# Patient Record
Sex: Female | Born: 1950 | Hispanic: No
Health system: Southern US, Community
[De-identification: ages and names within clinical notes are randomized; demographics above are authoritative.]

---

## 1998-01-09 ENCOUNTER — Other Ambulatory Visit: Admission: RE | Admit: 1998-01-09 | Discharge: 1998-01-09 | Payer: Self-pay | Admitting: *Deleted

## 1999-11-22 ENCOUNTER — Other Ambulatory Visit: Admission: RE | Admit: 1999-11-22 | Discharge: 1999-11-22 | Payer: Self-pay | Admitting: *Deleted

## 2004-12-06 ENCOUNTER — Other Ambulatory Visit: Admission: RE | Admit: 2004-12-06 | Discharge: 2004-12-06 | Payer: Self-pay | Admitting: Family Medicine

## 2005-03-24 ENCOUNTER — Other Ambulatory Visit: Admission: RE | Admit: 2005-03-24 | Discharge: 2005-03-24 | Payer: Self-pay | Admitting: Family Medicine

## 2008-01-02 ENCOUNTER — Other Ambulatory Visit: Admission: RE | Admit: 2008-01-02 | Discharge: 2008-01-02 | Payer: Self-pay | Admitting: Family Medicine

## 2009-02-25 ENCOUNTER — Other Ambulatory Visit: Admission: RE | Admit: 2009-02-25 | Discharge: 2009-02-25 | Payer: Self-pay | Admitting: Family Medicine

## 2012-12-11 ENCOUNTER — Other Ambulatory Visit: Payer: Self-pay | Admitting: Family Medicine

## 2012-12-11 ENCOUNTER — Other Ambulatory Visit (HOSPITAL_COMMUNITY)
Admission: RE | Admit: 2012-12-11 | Discharge: 2012-12-11 | Disposition: A | Discharge status: Home / Self Care | Payer: BC Managed Care – PPO | Source: Ambulatory Visit | Attending: Family Medicine | Admitting: Family Medicine

## 2012-12-11 DIAGNOSIS — Z01419 Encounter for gynecological examination (general) (routine) without abnormal findings: Secondary | ICD-10-CM | POA: Insufficient documentation

## 2015-04-03 ENCOUNTER — Other Ambulatory Visit: Payer: Self-pay | Admitting: General Surgery

## 2016-12-06 ENCOUNTER — Other Ambulatory Visit: Payer: Self-pay | Admitting: Family Medicine

## 2016-12-06 ENCOUNTER — Other Ambulatory Visit (HOSPITAL_COMMUNITY)
Admission: RE | Admit: 2016-12-06 | Discharge: 2016-12-06 | Disposition: A | Discharge status: Home / Self Care | Payer: BC Managed Care – PPO | Source: Ambulatory Visit | Attending: Family Medicine | Admitting: Family Medicine

## 2016-12-06 DIAGNOSIS — Z124 Encounter for screening for malignant neoplasm of cervix: Secondary | ICD-10-CM | POA: Insufficient documentation

## 2016-12-08 LAB — CYTOLOGY - PAP: DIAGNOSIS: NEGATIVE

## 2019-05-22 DIAGNOSIS — Z1231 Encounter for screening mammogram for malignant neoplasm of breast: Secondary | ICD-10-CM | POA: Diagnosis not present

## 2019-06-20 DIAGNOSIS — Z1211 Encounter for screening for malignant neoplasm of colon: Secondary | ICD-10-CM | POA: Diagnosis not present

## 2019-06-20 DIAGNOSIS — Z1389 Encounter for screening for other disorder: Secondary | ICD-10-CM | POA: Diagnosis not present

## 2019-06-20 DIAGNOSIS — Z23 Encounter for immunization: Secondary | ICD-10-CM | POA: Diagnosis not present

## 2019-06-20 DIAGNOSIS — Z0000 Encounter for general adult medical examination without abnormal findings: Secondary | ICD-10-CM | POA: Diagnosis not present

## 2019-06-20 DIAGNOSIS — E2839 Other primary ovarian failure: Secondary | ICD-10-CM | POA: Diagnosis not present

## 2019-06-20 DIAGNOSIS — E039 Hypothyroidism, unspecified: Secondary | ICD-10-CM | POA: Diagnosis not present

## 2019-06-20 DIAGNOSIS — E785 Hyperlipidemia, unspecified: Secondary | ICD-10-CM | POA: Diagnosis not present

## 2019-07-07 ENCOUNTER — Ambulatory Visit: Payer: BC Managed Care – PPO | Attending: Internal Medicine

## 2019-07-07 DIAGNOSIS — Z23 Encounter for immunization: Secondary | ICD-10-CM | POA: Insufficient documentation

## 2019-07-07 NOTE — Progress Notes (Signed)
   Covid-[facility] Vaccination [facility]  Name:  Ariana Taylor    MRN: [id number] DOB: 04-Jun-1950  07/07/2019  Ms. Ariana Taylor was observed post Covid-19 immunization for 15 minutes without incidence. She was provided with Vaccine Information Sheet and instruction to access the V-Safe system.   Ms. Ariana Taylor was instructed to call 911 with any severe reactions post vaccine: Marland Kitchen Difficulty breathing  . Swelling of your face and throat  . A fast heartbeat  . A bad rash all over your body  . Dizziness and weakness    Immunizations Administered    Name Date Dose VIS Date Route   Pfizer COVID-19 Vaccine 07/07/2019  8:37 AM 0.3 mL 04/26/2019 Intramuscular   Manufacturer: [place]   Lot: [id number]   [place]: [id number]

## 2019-07-11 DIAGNOSIS — Z780 Asymptomatic menopausal state: Secondary | ICD-10-CM | POA: Diagnosis not present

## 2019-07-11 DIAGNOSIS — M8589 Other specified disorders of bone density and structure, multiple sites: Secondary | ICD-10-CM | POA: Diagnosis not present

## 2019-07-31 ENCOUNTER — Ambulatory Visit: Payer: BC Managed Care – PPO | Attending: Internal Medicine

## 2019-07-31 DIAGNOSIS — Z23 Encounter for immunization: Secondary | ICD-10-CM

## 2019-07-31 NOTE — Progress Notes (Signed)
   Covid-19 Vaccination Clinic  Name:  Ariana Taylor    MRN: [id number] DOB: 02-01-1951  07/31/2019  [name] was observed post Covid-19 immunization for 15 minutes without incident. She was provided with Vaccine Information Sheet and instruction to access the V-Safe system.   [name] was instructed to call 911 with any severe reactions post vaccine: Marland Kitchen Difficulty breathing  . Swelling of face and throat  . A fast heartbeat  . A bad rash all over body  . Dizziness and weakness   Immunizations Administered    Name Date Dose VIS Date Route   Pfizer COVID-19 Vaccine 07/31/2019  8:37 AM 0.3 mL 04/26/2019 Intramuscular   Manufacturer: [place]   Lot: [id number]   [place]: [id number]

## 2020-02-12 DIAGNOSIS — Z20828 Contact with and (suspected) exposure to other viral communicable diseases: Secondary | ICD-10-CM | POA: Diagnosis not present

## 2020-02-17 DIAGNOSIS — D123 Benign neoplasm of transverse colon: Secondary | ICD-10-CM | POA: Diagnosis not present

## 2020-02-17 DIAGNOSIS — K648 Other hemorrhoids: Secondary | ICD-10-CM | POA: Diagnosis not present

## 2020-02-17 DIAGNOSIS — K6289 Other specified diseases of anus and rectum: Secondary | ICD-10-CM | POA: Diagnosis not present

## 2020-02-17 DIAGNOSIS — K5730 Diverticulosis of large intestine without perforation or abscess without bleeding: Secondary | ICD-10-CM | POA: Diagnosis not present

## 2020-02-17 DIAGNOSIS — Z1211 Encounter for screening for malignant neoplasm of colon: Secondary | ICD-10-CM | POA: Diagnosis not present

## 2020-02-19 DIAGNOSIS — D123 Benign neoplasm of transverse colon: Secondary | ICD-10-CM | POA: Diagnosis not present

## 2020-03-16 DIAGNOSIS — Z0100 Encounter for examination of eyes and vision without abnormal findings: Secondary | ICD-10-CM | POA: Diagnosis not present

## 2020-03-16 DIAGNOSIS — R262 Difficulty in walking, not elsewhere classified: Secondary | ICD-10-CM | POA: Diagnosis not present

## 2020-03-16 DIAGNOSIS — M6281 Muscle weakness (generalized): Secondary | ICD-10-CM | POA: Diagnosis not present

## 2020-05-29 DIAGNOSIS — Z1231 Encounter for screening mammogram for malignant neoplasm of breast: Secondary | ICD-10-CM | POA: Diagnosis not present

## 2020-06-08 DIAGNOSIS — R202 Paresthesia of skin: Secondary | ICD-10-CM | POA: Diagnosis not present

## 2020-06-08 DIAGNOSIS — E039 Hypothyroidism, unspecified: Secondary | ICD-10-CM | POA: Diagnosis not present

## 2020-06-08 DIAGNOSIS — E785 Hyperlipidemia, unspecified: Secondary | ICD-10-CM | POA: Diagnosis not present

## 2020-07-01 DIAGNOSIS — E039 Hypothyroidism, unspecified: Secondary | ICD-10-CM | POA: Diagnosis not present

## 2020-07-01 DIAGNOSIS — Z1389 Encounter for screening for other disorder: Secondary | ICD-10-CM | POA: Diagnosis not present

## 2020-07-01 DIAGNOSIS — Z0000 Encounter for general adult medical examination without abnormal findings: Secondary | ICD-10-CM | POA: Diagnosis not present

## 2020-07-01 DIAGNOSIS — R202 Paresthesia of skin: Secondary | ICD-10-CM | POA: Diagnosis not present

## 2020-07-01 DIAGNOSIS — E785 Hyperlipidemia, unspecified: Secondary | ICD-10-CM | POA: Diagnosis not present

## 2020-07-14 DIAGNOSIS — M48061 Spinal stenosis, lumbar region without neurogenic claudication: Secondary | ICD-10-CM | POA: Diagnosis not present

## 2020-07-15 ENCOUNTER — Other Ambulatory Visit: Payer: Self-pay | Admitting: Orthopedic Surgery

## 2020-07-15 DIAGNOSIS — M545 Low back pain, unspecified: Secondary | ICD-10-CM

## 2020-07-15 DIAGNOSIS — M48061 Spinal stenosis, lumbar region without neurogenic claudication: Secondary | ICD-10-CM

## 2020-08-20 ENCOUNTER — Ambulatory Visit
Admission: RE | Admit: 2020-08-20 | Discharge: 2020-08-20 | Disposition: A | Discharge status: Home / Self Care | Payer: BC Managed Care – PPO | Source: Ambulatory Visit | Attending: Orthopedic Surgery | Admitting: Orthopedic Surgery

## 2020-08-20 ENCOUNTER — Other Ambulatory Visit: Payer: Self-pay

## 2020-08-20 DIAGNOSIS — M545 Low back pain, unspecified: Secondary | ICD-10-CM

## 2020-08-20 DIAGNOSIS — M48061 Spinal stenosis, lumbar region without neurogenic claudication: Secondary | ICD-10-CM

## 2020-09-04 DIAGNOSIS — M5416 Radiculopathy, lumbar region: Secondary | ICD-10-CM | POA: Diagnosis not present

## 2020-09-14 DIAGNOSIS — M48061 Spinal stenosis, lumbar region without neurogenic claudication: Secondary | ICD-10-CM | POA: Diagnosis not present

## 2020-09-22 DIAGNOSIS — M48061 Spinal stenosis, lumbar region without neurogenic claudication: Secondary | ICD-10-CM | POA: Diagnosis not present

## 2020-10-13 DIAGNOSIS — M48061 Spinal stenosis, lumbar region without neurogenic claudication: Secondary | ICD-10-CM | POA: Diagnosis not present

## 2021-01-04 DIAGNOSIS — Z111 Encounter for screening for respiratory tuberculosis: Secondary | ICD-10-CM | POA: Diagnosis not present

## 2021-06-04 DIAGNOSIS — Z1231 Encounter for screening mammogram for malignant neoplasm of breast: Secondary | ICD-10-CM | POA: Diagnosis not present

## 2021-06-18 DIAGNOSIS — R202 Paresthesia of skin: Secondary | ICD-10-CM | POA: Diagnosis not present

## 2021-06-18 DIAGNOSIS — L538 Other specified erythematous conditions: Secondary | ICD-10-CM | POA: Diagnosis not present

## 2021-06-18 DIAGNOSIS — L821 Other seborrheic keratosis: Secondary | ICD-10-CM | POA: Diagnosis not present

## 2021-06-18 DIAGNOSIS — L298 Other pruritus: Secondary | ICD-10-CM | POA: Diagnosis not present

## 2021-06-18 DIAGNOSIS — L814 Other melanin hyperpigmentation: Secondary | ICD-10-CM | POA: Diagnosis not present

## 2021-06-18 DIAGNOSIS — L570 Actinic keratosis: Secondary | ICD-10-CM | POA: Diagnosis not present

## 2021-06-18 DIAGNOSIS — L708 Other acne: Secondary | ICD-10-CM | POA: Diagnosis not present

## 2021-06-18 DIAGNOSIS — L820 Inflamed seborrheic keratosis: Secondary | ICD-10-CM | POA: Diagnosis not present

## 2021-06-18 DIAGNOSIS — D2339 Other benign neoplasm of skin of other parts of face: Secondary | ICD-10-CM | POA: Diagnosis not present

## 2021-06-18 DIAGNOSIS — R208 Other disturbances of skin sensation: Secondary | ICD-10-CM | POA: Diagnosis not present

## 2021-07-12 DIAGNOSIS — L709 Acne, unspecified: Secondary | ICD-10-CM | POA: Diagnosis not present

## 2021-07-12 DIAGNOSIS — Z1389 Encounter for screening for other disorder: Secondary | ICD-10-CM | POA: Diagnosis not present

## 2021-07-12 DIAGNOSIS — N1831 Chronic kidney disease, stage 3a: Secondary | ICD-10-CM | POA: Diagnosis not present

## 2021-07-12 DIAGNOSIS — Z0000 Encounter for general adult medical examination without abnormal findings: Secondary | ICD-10-CM | POA: Diagnosis not present

## 2021-07-12 DIAGNOSIS — Z124 Encounter for screening for malignant neoplasm of cervix: Secondary | ICD-10-CM | POA: Diagnosis not present

## 2021-07-12 DIAGNOSIS — E785 Hyperlipidemia, unspecified: Secondary | ICD-10-CM | POA: Diagnosis not present

## 2021-07-12 DIAGNOSIS — E039 Hypothyroidism, unspecified: Secondary | ICD-10-CM | POA: Diagnosis not present

## 2021-07-12 DIAGNOSIS — N841 Polyp of cervix uteri: Secondary | ICD-10-CM | POA: Diagnosis not present

## 2021-07-12 DIAGNOSIS — Z1159 Encounter for screening for other viral diseases: Secondary | ICD-10-CM | POA: Diagnosis not present

## 2021-09-13 DIAGNOSIS — R058 Other specified cough: Secondary | ICD-10-CM | POA: Diagnosis not present

## 2021-12-06 DIAGNOSIS — E039 Hypothyroidism, unspecified: Secondary | ICD-10-CM | POA: Diagnosis not present

## 2021-12-06 DIAGNOSIS — M79672 Pain in left foot: Secondary | ICD-10-CM | POA: Diagnosis not present

## 2021-12-20 DIAGNOSIS — L578 Other skin changes due to chronic exposure to nonionizing radiation: Secondary | ICD-10-CM | POA: Diagnosis not present

## 2021-12-20 DIAGNOSIS — B001 Herpesviral vesicular dermatitis: Secondary | ICD-10-CM | POA: Diagnosis not present

## 2021-12-20 DIAGNOSIS — L814 Other melanin hyperpigmentation: Secondary | ICD-10-CM | POA: Diagnosis not present

## 2021-12-20 DIAGNOSIS — D2239 Melanocytic nevi of other parts of face: Secondary | ICD-10-CM | POA: Diagnosis not present

## 2021-12-20 DIAGNOSIS — D220 Melanocytic nevi of lip: Secondary | ICD-10-CM | POA: Diagnosis not present

## 2022-03-01 DIAGNOSIS — L918 Other hypertrophic disorders of the skin: Secondary | ICD-10-CM | POA: Diagnosis not present

## 2022-03-25 IMAGING — MR MR LUMBAR SPINE W/O CM
4 of 5 series · 25 of 48 positions shown · non-contrast
Comparison: None.

CLINICAL DATA: Lumbar spinal stenosis without neurogenic
claudication. Low back pain.

EXAM:
MRI LUMBAR SPINE WITHOUT CONTRAST
TECHNIQUE: Multiplanar, multisequence MR imaging of the lumbar spine was
performed. No intravenous contrast was administered.

[Series 2: T2 · sagittal · 4.0mm · 0.53mm/px · 6 of 16 slices shown (1 of 2)]
[im 1/16]
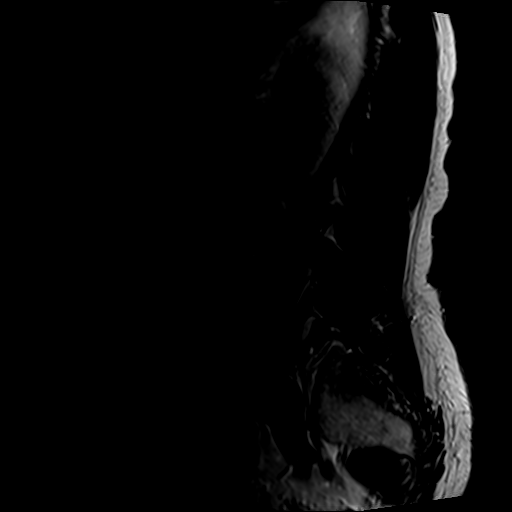
[im 4/16]
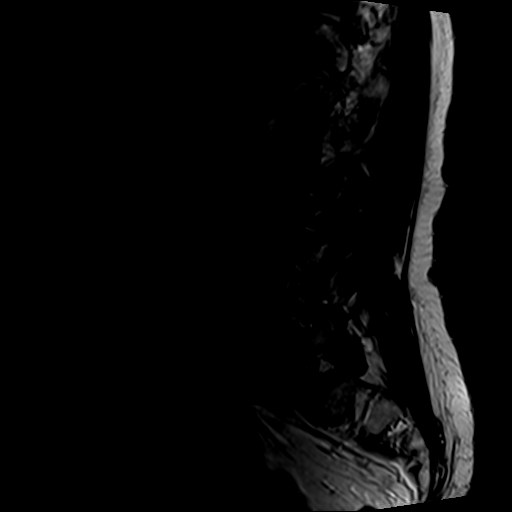
[im 7/16]
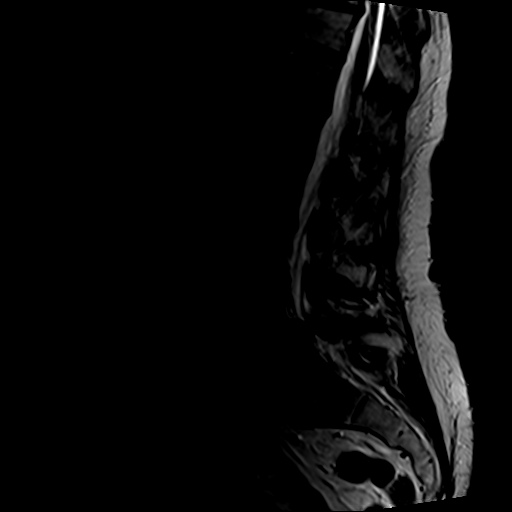
[im 10/16]
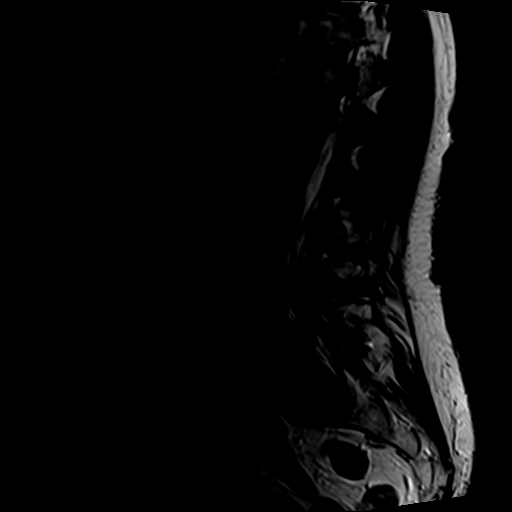
[im 13/16]
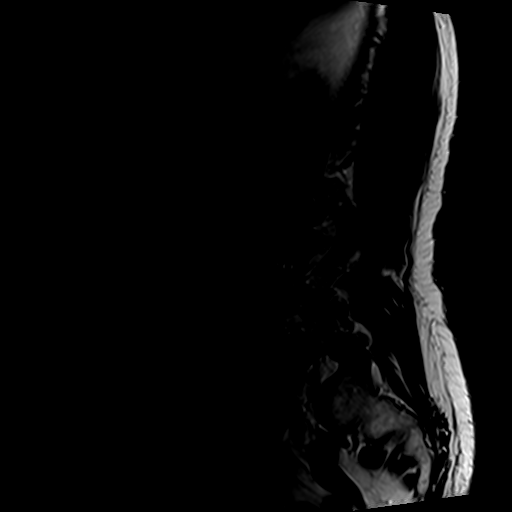
[im 16/16]
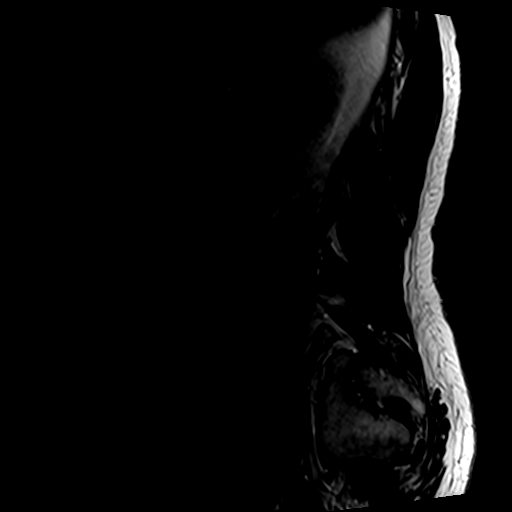

[Series 4: T1 · sagittal · 4.0mm · 0.53mm/px · 7 of 16 slices shown (1 of 2)]
[im 1/16]
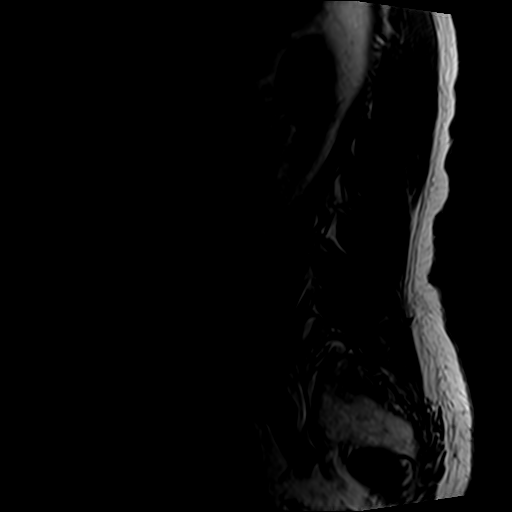
[im 3/16]
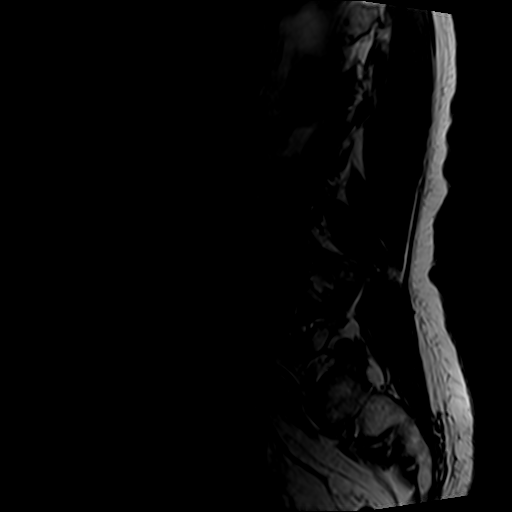
[im 6/16]
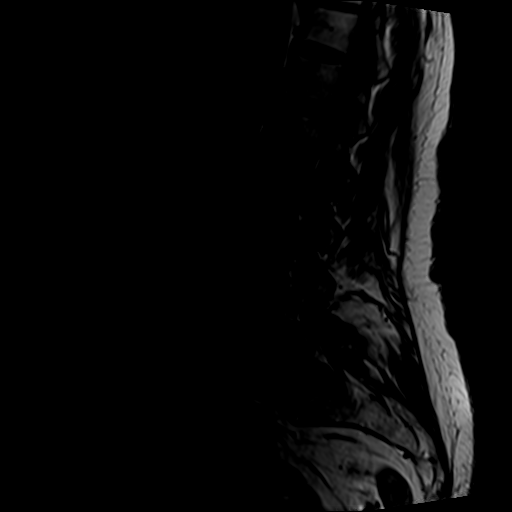
[im 8/16]
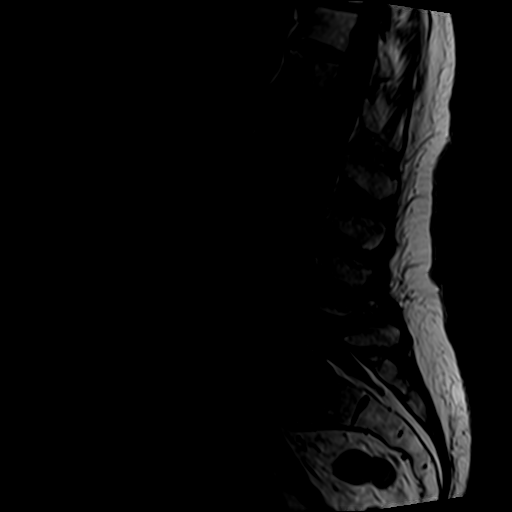
[im 11/16]
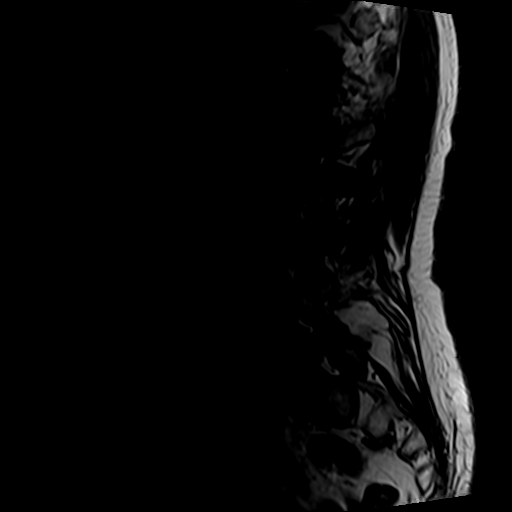
[im 13/16]
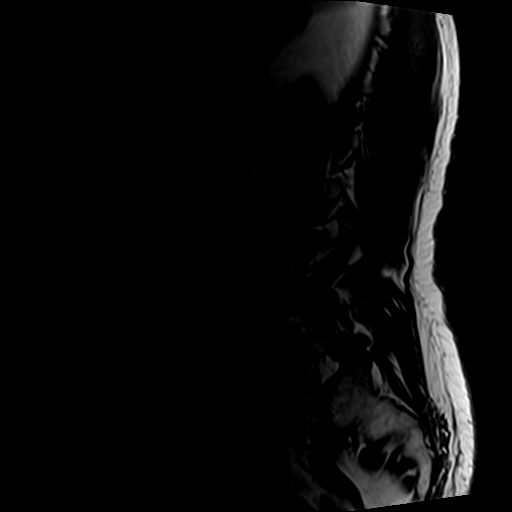
[im 16/16]
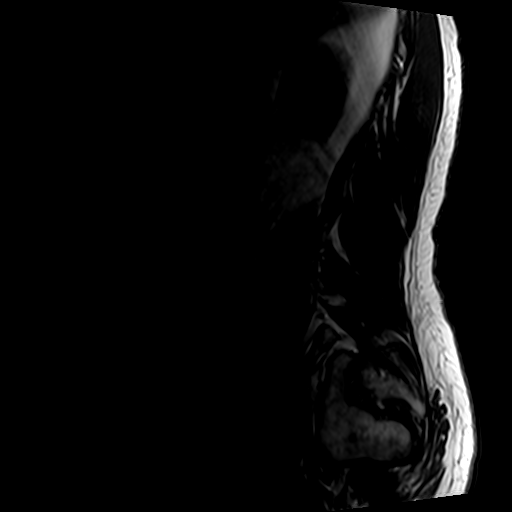

[Series 5: T2 · axial · 4.0mm · 0.70mm/px · z∈[-89,+118]mm · 8 of 35 slices shown (2 of 2)]
[im 1/35]
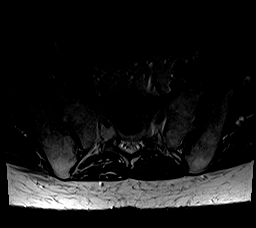
[im 6/35]
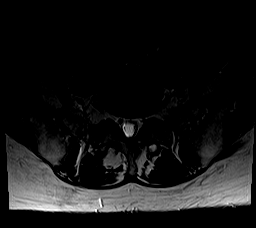
[im 11/35]
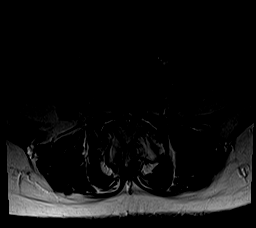
[im 16/35]
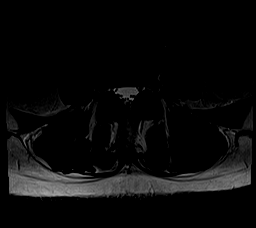
[im 19/35]
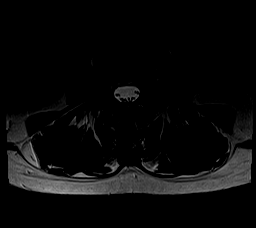
[im 24/35]
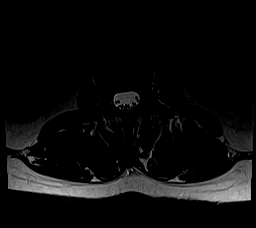
[im 29/35]
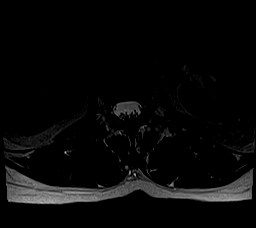
[im 35/35]
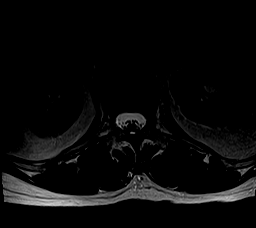

[Series 6: T1 · axial · 4.0mm · 0.35mm/px · z∈[-89,+87]mm · 4 of 35 slices shown (2 of 2)]
[im 1/35]
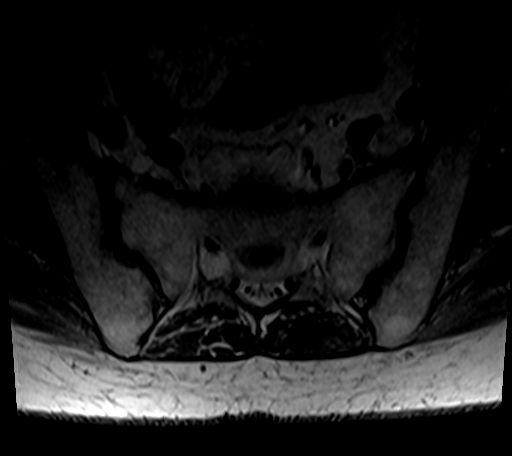
[im 6/35]
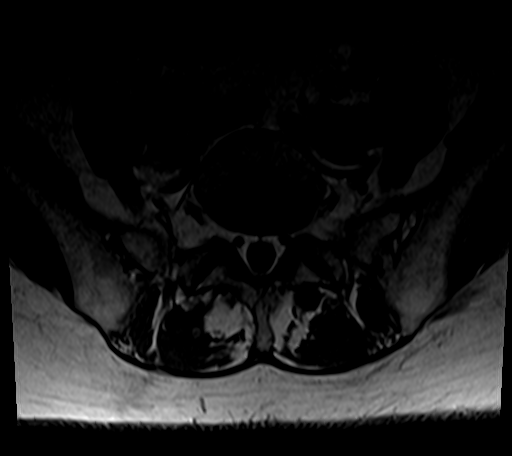
[im 19/35]
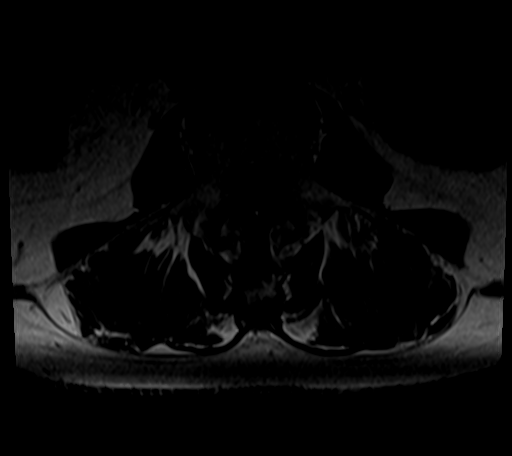
[im 29/35]
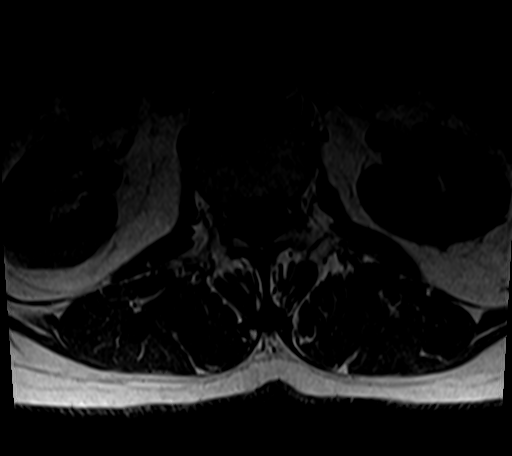

[25 of 48 positions shown; findings below may reference images not displayed]

FINDINGS: Segmentation:  Normal

Alignment:  6 mm anterolisthesis L4-5.  Remaining alignment normal.

Vertebrae:  Normal bone marrow.  Negative for fracture or mass.

Conus medullaris and cauda equina: Conus extends to the T12-L1
level. Conus and cauda equina appear normal.

Paraspinal and other soft tissues: Negative for paraspinous mass or
adenopathy.

Disc levels:

T12-L1: Mild disc degeneration.  Negative for stenosis

L2-1 2: Negative

L2-3: Mild disc bulging and mild facet degeneration. Negative for
disc protrusion or stenosis

L3-4: Mild disc bulging and mild facet degeneration. Negative for
stenosis

L4-5: Severe spinal stenosis. 6 mm anterolisthesis with severe facet
degeneration. Moderate to severe subarticular stenosis bilaterally
with impingement of the L5 nerve root bilaterally. L4 nerve roots
exit freely.

L5-S1: Mild facet degeneration bilaterally. Negative for disc
protrusion or stenosis. 1 cm synovial cysts project posterior to the
facet joints bilaterally without neural impingement.
IMPRESSION: Severe spinal stenosis at L4-5 with moderate to severe subarticular
stenosis bilaterally.

No other significant neural impingement.

## 2022-04-14 DIAGNOSIS — M25569 Pain in unspecified knee: Secondary | ICD-10-CM | POA: Diagnosis not present

## 2022-04-14 DIAGNOSIS — M25559 Pain in unspecified hip: Secondary | ICD-10-CM | POA: Diagnosis not present

## 2022-06-10 DIAGNOSIS — Z1231 Encounter for screening mammogram for malignant neoplasm of breast: Secondary | ICD-10-CM | POA: Diagnosis not present

## 2022-06-22 DIAGNOSIS — L570 Actinic keratosis: Secondary | ICD-10-CM | POA: Diagnosis not present

## 2022-06-22 DIAGNOSIS — B001 Herpesviral vesicular dermatitis: Secondary | ICD-10-CM | POA: Diagnosis not present

## 2022-06-22 DIAGNOSIS — L218 Other seborrheic dermatitis: Secondary | ICD-10-CM | POA: Diagnosis not present

## 2022-06-22 DIAGNOSIS — L298 Other pruritus: Secondary | ICD-10-CM | POA: Diagnosis not present

## 2022-06-22 DIAGNOSIS — D225 Melanocytic nevi of trunk: Secondary | ICD-10-CM | POA: Diagnosis not present

## 2022-06-22 DIAGNOSIS — L814 Other melanin hyperpigmentation: Secondary | ICD-10-CM | POA: Diagnosis not present

## 2022-06-22 DIAGNOSIS — L821 Other seborrheic keratosis: Secondary | ICD-10-CM | POA: Diagnosis not present

## 2022-06-22 DIAGNOSIS — L578 Other skin changes due to chronic exposure to nonionizing radiation: Secondary | ICD-10-CM | POA: Diagnosis not present

## 2022-06-22 DIAGNOSIS — L209 Atopic dermatitis, unspecified: Secondary | ICD-10-CM | POA: Diagnosis not present

## 2022-06-28 DIAGNOSIS — R69 Illness, unspecified: Secondary | ICD-10-CM | POA: Diagnosis not present

## 2022-07-11 DIAGNOSIS — R69 Illness, unspecified: Secondary | ICD-10-CM | POA: Diagnosis not present

## 2022-07-21 DIAGNOSIS — R69 Illness, unspecified: Secondary | ICD-10-CM | POA: Diagnosis not present

## 2022-11-09 DIAGNOSIS — E039 Hypothyroidism, unspecified: Secondary | ICD-10-CM | POA: Diagnosis not present

## 2022-11-09 DIAGNOSIS — R252 Cramp and spasm: Secondary | ICD-10-CM | POA: Diagnosis not present

## 2022-11-09 DIAGNOSIS — R944 Abnormal results of kidney function studies: Secondary | ICD-10-CM | POA: Diagnosis not present

## 2022-11-19 DIAGNOSIS — R69 Illness, unspecified: Secondary | ICD-10-CM | POA: Diagnosis not present

## 2023-03-20 DIAGNOSIS — Z0000 Encounter for general adult medical examination without abnormal findings: Secondary | ICD-10-CM | POA: Diagnosis not present

## 2023-03-20 DIAGNOSIS — N1831 Chronic kidney disease, stage 3a: Secondary | ICD-10-CM | POA: Diagnosis not present

## 2023-03-20 DIAGNOSIS — Z23 Encounter for immunization: Secondary | ICD-10-CM | POA: Diagnosis not present

## 2023-03-20 DIAGNOSIS — Z1331 Encounter for screening for depression: Secondary | ICD-10-CM | POA: Diagnosis not present

## 2023-03-20 DIAGNOSIS — E785 Hyperlipidemia, unspecified: Secondary | ICD-10-CM | POA: Diagnosis not present

## 2023-03-20 DIAGNOSIS — E2839 Other primary ovarian failure: Secondary | ICD-10-CM | POA: Diagnosis not present

## 2023-03-20 DIAGNOSIS — E039 Hypothyroidism, unspecified: Secondary | ICD-10-CM | POA: Diagnosis not present

## 2023-03-21 DIAGNOSIS — R69 Illness, unspecified: Secondary | ICD-10-CM | POA: Diagnosis not present

## 2023-06-23 DIAGNOSIS — M8588 Other specified disorders of bone density and structure, other site: Secondary | ICD-10-CM | POA: Diagnosis not present

## 2023-06-23 DIAGNOSIS — E039 Hypothyroidism, unspecified: Secondary | ICD-10-CM | POA: Diagnosis not present

## 2023-06-23 DIAGNOSIS — Z1231 Encounter for screening mammogram for malignant neoplasm of breast: Secondary | ICD-10-CM | POA: Diagnosis not present

## 2023-06-23 DIAGNOSIS — R29890 Loss of height: Secondary | ICD-10-CM | POA: Diagnosis not present

## 2023-06-28 DIAGNOSIS — M7061 Trochanteric bursitis, right hip: Secondary | ICD-10-CM | POA: Diagnosis not present

## 2023-07-20 DIAGNOSIS — E559 Vitamin D deficiency, unspecified: Secondary | ICD-10-CM | POA: Diagnosis not present

## 2023-08-31 DIAGNOSIS — M25559 Pain in unspecified hip: Secondary | ICD-10-CM | POA: Diagnosis not present

## 2023-08-31 DIAGNOSIS — M545 Low back pain, unspecified: Secondary | ICD-10-CM | POA: Diagnosis not present

## 2023-09-13 DIAGNOSIS — M545 Low back pain, unspecified: Secondary | ICD-10-CM | POA: Diagnosis not present

## 2023-09-13 DIAGNOSIS — M25559 Pain in unspecified hip: Secondary | ICD-10-CM | POA: Diagnosis not present

## 2023-10-12 DIAGNOSIS — M25559 Pain in unspecified hip: Secondary | ICD-10-CM | POA: Diagnosis not present

## 2023-10-12 DIAGNOSIS — M545 Low back pain, unspecified: Secondary | ICD-10-CM | POA: Diagnosis not present

## 2023-11-06 DIAGNOSIS — M545 Low back pain, unspecified: Secondary | ICD-10-CM | POA: Diagnosis not present

## 2023-11-06 DIAGNOSIS — M25559 Pain in unspecified hip: Secondary | ICD-10-CM | POA: Diagnosis not present

## 2024-02-23 DIAGNOSIS — M545 Low back pain, unspecified: Secondary | ICD-10-CM | POA: Diagnosis not present

## 2024-03-07 DIAGNOSIS — H04123 Dry eye syndrome of bilateral lacrimal glands: Secondary | ICD-10-CM | POA: Diagnosis not present

## 2024-03-07 DIAGNOSIS — H52223 Regular astigmatism, bilateral: Secondary | ICD-10-CM | POA: Diagnosis not present

## 2024-03-07 DIAGNOSIS — H524 Presbyopia: Secondary | ICD-10-CM | POA: Diagnosis not present

## 2024-03-07 DIAGNOSIS — H5203 Hypermetropia, bilateral: Secondary | ICD-10-CM | POA: Diagnosis not present

## 2024-03-07 DIAGNOSIS — H259 Unspecified age-related cataract: Secondary | ICD-10-CM | POA: Diagnosis not present

## 2024-04-18 DIAGNOSIS — E039 Hypothyroidism, unspecified: Secondary | ICD-10-CM | POA: Diagnosis not present

## 2024-04-18 DIAGNOSIS — E785 Hyperlipidemia, unspecified: Secondary | ICD-10-CM | POA: Diagnosis not present
